# Patient Record
Sex: Female | Born: 1975 | Race: White | Hispanic: No | Marital: Married | Smoking: Current every day smoker
Health system: Southern US, Community
[De-identification: ages and names within clinical notes are randomized; demographics above are authoritative.]

## PROBLEM LIST (undated history)

## (undated) DIAGNOSIS — G479 Sleep disorder, unspecified: Secondary | ICD-10-CM

## (undated) DIAGNOSIS — L509 Urticaria, unspecified: Secondary | ICD-10-CM

## (undated) DIAGNOSIS — M8580 Other specified disorders of bone density and structure, unspecified site: Secondary | ICD-10-CM

## (undated) DIAGNOSIS — F419 Anxiety disorder, unspecified: Secondary | ICD-10-CM

## (undated) HISTORY — DX: Other specified disorders of bone density and structure, unspecified site: M85.80

## (undated) HISTORY — DX: Sleep disorder, unspecified: G47.9

## (undated) HISTORY — PX: OTHER SURGICAL HISTORY: SHX169

## (undated) HISTORY — DX: Anxiety disorder, unspecified: F41.9

## (undated) HISTORY — DX: Urticaria, unspecified: L50.9

---
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
## 1998-10-23 HISTORY — PX: TUBAL LIGATION: SHX77

## 1998-10-23 HISTORY — PX: APPENDECTOMY: SHX54

## 2000-10-23 HISTORY — PX: SUTURE REMOVAL: SHX6354

## 2016-11-15 ENCOUNTER — Ambulatory Visit: Payer: Self-pay | Admitting: Allergy and Immunology

## 2016-11-27 ENCOUNTER — Ambulatory Visit (INDEPENDENT_AMBULATORY_CARE_PROVIDER_SITE_OTHER): Payer: Medicaid Other | Admitting: Allergy and Immunology

## 2016-11-27 ENCOUNTER — Encounter: Payer: Self-pay | Admitting: Allergy and Immunology

## 2016-11-27 VITALS — BP 112/78 | HR 84 | Temp 98.2°F | Resp 16 | Ht 68.11 in | Wt 196.4 lb

## 2016-11-27 DIAGNOSIS — L509 Urticaria, unspecified: Secondary | ICD-10-CM

## 2016-11-27 MED ORDER — RANITIDINE HCL 150 MG PO TABS: 150.0000 mg | ORAL_TABLET | Freq: Every day | ORAL | 5 refills | Status: DC

## 2016-11-27 MED ORDER — CETIRIZINE HCL 10 MG PO TABS: 10.0000 mg | ORAL_TABLET | Freq: Every day | ORAL | 5 refills | Status: AC

## 2016-11-27 MED ORDER — EPINEPHRINE 0.3 MG/0.3ML IJ SOAJ: INTRAMUSCULAR | 3 refills | Status: AC

## 2016-11-27 NOTE — Patient Instructions (Addendum)
  1. Allergen avoidance measures?  2. Blood - CBC w/diff, CMP, TSH, T4, TP, SED, UA  3. Review Dr. Sandy SalaamWoodyear's allergy panel blood test  4. Continue montelukast 10 mg daily  5. If needed can utilize the following plan:   A. continue montelukast 10 mg daily  B. start cetirizine 10 mg daily  C. start ranitidine 150 mg daily  D. can add Benadryl if needed  6. Epi-Pen, Benadryl, MD/ER evaluation for allergic reaction  7. Further evaluation? Yes if recurrent  8. Contact clinic with outbreak

## 2016-11-27 NOTE — Progress Notes (Signed)
Dear Dr. Coralee Pesa,  Thank you for referring Vanessa Brooks to the [facility] [place] on 11/27/2016.   Below is a summation of this patient's evaluation and recommendations.  Thank you for your referral. I will keep you informed about this patient's response to treatment.   If you have any questions please do not hesitate to contact me.   Sincerely,  Jessica Priest, MD Allergy / Immunology  [facility]   ______________________________________________________________________    NEW PATIENT NOTE  Referring Provider: Allyson Sabal, MD Primary Provider: Allyson Sabal, MD Date of office visit: 11/27/2016    Subjective:   Chief Complaint:  Vanessa Brooks (DOB: 1976-03-17) is a 41 y.o. female who presents to the clinic on 11/27/2016 with a chief complaint of Urticaria .     HPI: [place] presents to this clinic in evaluation of hives. She has a long history of chronic idiopathic urticaria of many decades duration that appears to flare with a waxing and waning pattern on every other mouth basis usually for a month or so. She had a flare up in February 2017 and did relatively well until December 2017. She still had active disease since that December flareup until she started Singulair. She's been on Singulair for 2 weeks and her hives have resolved.  She shows me a picture from her cell phone of giant urticarial lesions approximately 15-20 cm diameter. They are extremely erythematous.  She never has any healing with scar or hyperpigmentation. She does have occasional breathing problems associated with these giant urticarial lesions and feeling as though her throat sometimes gets swollen. She's been to the emergency room both in February and December of last year in evaluation of this issue. It sounds as though she's usually treated with a systemic steroid. She uses Benadryl on a pretty regular basis as  well. She's been given several other antihistamines in the past including cyproheptadine which she doesn't really think helps her very much.  There does not appear to be any obvious provoking factor giving rise to these issues.  Recently she had some blood tests performed by her primary care doctor the results of which are unavailable at this point in time for review. In review of medical records it does appear as though a food and aero allergen hypersensitivity profile was ordered.  Past Medical History:  Diagnosis Date  . Anxiety disorder   . Osteopenia   . Sleep disorder   . Urticaria     Past Surgical History:  Procedure Laterality Date  . ADENOIDECTOMY  1989  . APPENDECTOMY  2000  . CHOLECYSTECTOMY  1992  . SUTURE REMOVAL  2002  . TONSILLECTOMY  1989  . TUBAL LIGATION  2000    Allergies as of 11/27/2016   No Known Allergies     Medication List      cyproheptadine 4 MG tablet Commonly known as:  PERIACTIN Take 4 mg by mouth as needed for allergies.   DAILY MULTIVITAMIN PO Take by mouth daily.   LORazepam 1 MG tablet Commonly known as:  ATIVAN Take 1 mg by mouth as needed for anxiety.   PROBIOTIC PO Take by mouth daily.   SINGULAIR 10 MG tablet Generic drug:  montelukast Take 10 mg by mouth daily.   traMADol 50 MG tablet Commonly known as:  ULTRAM Take 50 mg by mouth as needed.   traZODone 50 MG tablet Commonly known as:  DESYREL Take 50 mg  by mouth as needed for sleep.   VITAMIN D PO Take 1,200 mg by mouth daily.       Review of systems negative except as noted in HPI / PMHx or noted below:  Review of Systems  Constitutional: Negative.   HENT: Negative.   Eyes: Negative.   Respiratory: Negative.   Cardiovascular: Negative.   Gastrointestinal: Negative.   Genitourinary: Negative.   Musculoskeletal: Negative.   Skin: Negative.   Neurological: Negative.   Endo/Heme/Allergies: Negative.   Psychiatric/Behavioral: Negative.     Family  History  Problem Relation Age of Onset  . Allergic rhinitis Brother   . Diabetes Paternal Grandmother     Social History   Social History  . Marital status: Married    Spouse name: N/A  . Number of children: N/A  . Years of education: N/A   Occupational History  . Not on file.   Social History Main Topics  . Smoking status: Current Every Day Smoker    Types: E-cigarettes  . Smokeless tobacco: Never Used  . Alcohol use No  . Drug use: No  . Sexual activity: Not on file   Other Topics Concern  . Not on file   Social History Narrative  . No narrative on file    Environmental and Social history  Lives in a house with a dry environment, 2 cats located inside the household, carpeting in the bedroom, no plastic on the bed or pillow, and no smokers located inside the household. She works at [employer].  Objective:   Vitals:   11/27/16 1346  BP: 112/78  Pulse: 84  Resp: 16  Temp: 98.2 F (36.8 C)   Height: 5' 8.11" (173 cm) Weight: 196 lb 6.4 oz (89.1 kg)  Physical Exam  Constitutional: She is well-developed, well-nourished, and in no distress.  HENT:  Head: Normocephalic. Head is without right periorbital erythema and without left periorbital erythema.  Right Ear: Tympanic membrane, external ear and ear canal normal.  Left Ear: Tympanic membrane, external ear and ear canal normal.  Nose: Nose normal. No mucosal edema or rhinorrhea.  Mouth/Throat: Uvula is midline, oropharynx is clear and moist and mucous membranes are normal. No oropharyngeal exudate.  Eyes: Conjunctivae and lids are normal. Pupils are equal, round, and reactive to light.  Neck: Trachea normal. No tracheal tenderness present. No tracheal deviation present. No thyromegaly present.  Cardiovascular: Normal rate, regular rhythm, S1 normal, S2 normal and normal heart sounds.   No murmur heard. Pulmonary/Chest: Effort normal and breath sounds normal. No stridor. No tachypnea. No  respiratory distress. She has no wheezes. She has no rales. She exhibits no tenderness.  Abdominal: Soft. She exhibits no distension and no mass. There is no hepatosplenomegaly. There is no tenderness. There is no rebound and no guarding.  Musculoskeletal: She exhibits no edema or tenderness.  Lymphadenopathy:       Head (right side): No tonsillar adenopathy present.       Head (left side): No tonsillar adenopathy present.    She has no cervical adenopathy.    She has no axillary adenopathy.  Neurological: She is alert. Gait normal.  Skin: No rash noted. She is not diaphoretic. No erythema. No pallor. Nails show no clubbing.  Psychiatric: Mood and affect normal.    Diagnostics: None  Assessment and Plan:    1. Urticaria     1. Allergen avoidance measures?  2. Blood - CBC w/diff, CMP, TSH, T4, TP, SED, UA  3. Review  Dr. Sandy SalaamWoodyear's allergy panel blood test  4. Continue montelukast 10 mg daily  5. If needed can utilize the following plan:   A. continue montelukast 10 mg daily  B. start cetirizine 10 mg daily  C. start ranitidine 150 mg daily  D. can add Benadryl if needed  6. Epi-Pen, Benadryl, MD/ER evaluation for allergic reaction  7. Further evaluation? Yes if recurrent  8. Contact clinic with outbreak  [name] has discovered an agent that gives rise to excellent control of her urticaria and this will make us be somewhat hesitant about pursuing further evaluation and treatment for her abnormal immune system manifested as chronic urticaria. I will do a cursory exam of her major organ function and to see if she does have chronic urticaria associate with thyroiditis. She will continue to use montelukast and I given her selection of other agents that she could utilize if she does develop increased activity of this issue while remaining on montelukast. I did give her a epinephrine autoinjector device especially given the fact that she does occasionally develop systemic symptoms  involving her respiratory tract with her urticarial reactions. She will contact me should she develop recurrent problems in the future with the therapy mentioned above. Obviously if she continues to have recurrent outbreaks as she moves forward then she is going to require further evaluation and treatment.  Jessica PriestEric J. Yarielys Beed, MD [facility] [place]

## 2016-11-29 LAB — CBC WITH DIFFERENTIAL/PLATELET
BASOS: 1 %
Basophils Absolute: 0.1 10*3/uL (ref 0.0–0.2)
EOS (ABSOLUTE): 0.4 10*3/uL (ref 0.0–0.4)
EOS: 4 %
HEMATOCRIT: 40.8 % (ref 34.0–46.6)
Hemoglobin: 13.6 g/dL (ref 11.1–15.9)
Immature Grans (Abs): 0 10*3/uL (ref 0.0–0.1)
Immature Granulocytes: 0 %
LYMPHS ABS: 2.9 10*3/uL (ref 0.7–3.1)
Lymphs: 30 %
MCH: 30.1 pg (ref 26.6–33.0)
MCHC: 33.3 g/dL (ref 31.5–35.7)
MCV: 90 fL (ref 79–97)
MONOS ABS: 0.8 10*3/uL (ref 0.1–0.9)
Monocytes: 8 %
Neutrophils Absolute: 5.5 10*3/uL (ref 1.4–7.0)
Neutrophils: 57 %
PLATELETS: 406 10*3/uL — AB (ref 150–379)
RBC: 4.52 x10E6/uL (ref 3.77–5.28)
RDW: 13.9 % (ref 12.3–15.4)
WBC: 9.8 10*3/uL (ref 3.4–10.8)

## 2016-11-29 LAB — URINALYSIS
BILIRUBIN UA: NEGATIVE
Glucose, UA: NEGATIVE
Ketones, UA: NEGATIVE
Leukocytes, UA: NEGATIVE
NITRITE UA: NEGATIVE
PH UA: 6.5 (ref 5.0–7.5)
Protein, UA: NEGATIVE
RBC UA: NEGATIVE
SPEC GRAV UA: 1.006 (ref 1.005–1.030)
UUROB: 0.2 mg/dL (ref 0.2–1.0)

## 2016-11-29 LAB — THYROID PEROXIDASE ANTIBODY: THYROID PEROXIDASE ANTIBODY: 66 [IU]/mL — AB (ref 0–34)

## 2016-11-29 LAB — COMPREHENSIVE METABOLIC PANEL
A/G RATIO: 1.6 (ref 1.2–2.2)
ALK PHOS: 47 IU/L (ref 39–117)
ALT: 10 IU/L (ref 0–32)
AST: 13 IU/L (ref 0–40)
Albumin: 4.6 g/dL (ref 3.5–5.5)
BUN/Creatinine Ratio: 14 (ref 9–23)
BUN: 9 mg/dL (ref 6–24)
Bilirubin Total: 0.5 mg/dL (ref 0.0–1.2)
CHLORIDE: 99 mmol/L (ref 96–106)
CO2: 23 mmol/L (ref 18–29)
Calcium: 9.2 mg/dL (ref 8.7–10.2)
Creatinine, Ser: 0.64 mg/dL (ref 0.57–1.00)
GFR calc Af Amer: 127 mL/min/{1.73_m2} (ref >59)
GFR, EST NON AFRICAN AMERICAN: 110 mL/min/{1.73_m2} (ref >59)
GLOBULIN, TOTAL: 2.8 g/dL (ref 1.5–4.5)
Glucose: 97 mg/dL (ref 65–99)
POTASSIUM: 4.2 mmol/L (ref 3.5–5.2)
SODIUM: 140 mmol/L (ref 134–144)
Total Protein: 7.4 g/dL (ref 6.0–8.5)

## 2016-11-29 LAB — TSH+FREE T4
Free T4: 1.12 ng/dL (ref 0.82–1.77)
TSH: 6.45 u[IU]/mL — ABNORMAL HIGH (ref 0.450–4.500)

## 2016-11-29 LAB — SEDIMENTATION RATE: Sed Rate: 2 mm/hr (ref 0–32)

## 2016-12-21 ENCOUNTER — Ambulatory Visit: Payer: Medicaid Other | Admitting: Allergy and Immunology

## 2017-02-22 ENCOUNTER — Ambulatory Visit: Payer: Medicaid Other | Admitting: Allergy and Immunology

## 2017-10-05 ENCOUNTER — Emergency Department (HOSPITAL_COMMUNITY)
Admission: EM | Admit: 2017-10-05 | Discharge: 2017-10-06 | Disposition: A | Discharge status: Home / Self Care | Payer: Medicaid Other | Attending: Emergency Medicine | Admitting: Emergency Medicine

## 2017-10-05 ENCOUNTER — Emergency Department (HOSPITAL_COMMUNITY): Payer: Medicaid Other

## 2017-10-05 ENCOUNTER — Other Ambulatory Visit: Payer: Self-pay

## 2017-10-05 DIAGNOSIS — R51 Headache: Secondary | ICD-10-CM | POA: Diagnosis not present

## 2017-10-05 DIAGNOSIS — Y92410 Unspecified street and highway as the place of occurrence of the external cause: Secondary | ICD-10-CM | POA: Insufficient documentation

## 2017-10-05 DIAGNOSIS — Y999 Unspecified external cause status: Secondary | ICD-10-CM | POA: Insufficient documentation

## 2017-10-05 DIAGNOSIS — F17210 Nicotine dependence, cigarettes, uncomplicated: Secondary | ICD-10-CM | POA: Diagnosis not present

## 2017-10-05 DIAGNOSIS — S22000A Wedge compression fracture of unspecified thoracic vertebra, initial encounter for closed fracture: Secondary | ICD-10-CM | POA: Diagnosis not present

## 2017-10-05 DIAGNOSIS — R0789 Other chest pain: Secondary | ICD-10-CM | POA: Diagnosis not present

## 2017-10-05 DIAGNOSIS — Y939 Activity, unspecified: Secondary | ICD-10-CM | POA: Insufficient documentation

## 2017-10-05 DIAGNOSIS — Z23 Encounter for immunization: Secondary | ICD-10-CM | POA: Diagnosis not present

## 2017-10-05 DIAGNOSIS — M5134 Other intervertebral disc degeneration, thoracic region: Secondary | ICD-10-CM | POA: Insufficient documentation

## 2017-10-05 DIAGNOSIS — S3992XA Unspecified injury of lower back, initial encounter: Secondary | ICD-10-CM | POA: Diagnosis present

## 2017-10-05 DIAGNOSIS — Q7649 Other congenital malformations of spine, not associated with scoliosis: Secondary | ICD-10-CM

## 2017-10-05 LAB — CBC
HCT: 40.1 % (ref 36.0–46.0)
HEMOGLOBIN: 13.4 g/dL (ref 12.0–15.0)
MCH: 30.5 pg (ref 26.0–34.0)
MCHC: 33.4 g/dL (ref 30.0–36.0)
MCV: 91.3 fL (ref 78.0–100.0)
PLATELETS: 379 10*3/uL (ref 150–400)
RBC: 4.39 MIL/uL (ref 3.87–5.11)
RDW: 13.4 % (ref 11.5–15.5)
WBC: 25.3 10*3/uL — ABNORMAL HIGH (ref 4.0–10.5)

## 2017-10-05 LAB — COMPREHENSIVE METABOLIC PANEL
ALBUMIN: 3.9 g/dL (ref 3.5–5.0)
ALK PHOS: 49 U/L (ref 38–126)
ALT: 33 U/L (ref 14–54)
ANION GAP: 7 (ref 5–15)
AST: 56 U/L — ABNORMAL HIGH (ref 15–41)
BILIRUBIN TOTAL: 0.8 mg/dL (ref 0.3–1.2)
BUN: 8 mg/dL (ref 6–20)
CALCIUM: 8.8 mg/dL — AB (ref 8.9–10.3)
CO2: 25 mmol/L (ref 22–32)
CREATININE: 0.75 mg/dL (ref 0.44–1.00)
Chloride: 107 mmol/L (ref 101–111)
GFR calc Af Amer: >60 mL/min (ref >60)
GFR calc non Af Amer: >60 mL/min (ref >60)
GLUCOSE: 105 mg/dL — AB (ref 65–99)
Potassium: 3.7 mmol/L (ref 3.5–5.1)
Sodium: 139 mmol/L (ref 135–145)
TOTAL PROTEIN: 7.1 g/dL (ref 6.5–8.1)

## 2017-10-05 LAB — URINALYSIS, ROUTINE W REFLEX MICROSCOPIC
Bilirubin Urine: NEGATIVE
GLUCOSE, UA: NEGATIVE mg/dL
Ketones, ur: 5 mg/dL — AB
Leukocytes, UA: NEGATIVE
Nitrite: NEGATIVE
PH: 7 (ref 5.0–8.0)
Protein, ur: 30 mg/dL — AB
SPECIFIC GRAVITY, URINE: 1.01 (ref 1.005–1.030)

## 2017-10-05 LAB — PROTIME-INR
INR: 1.01
PROTHROMBIN TIME: 13.2 s (ref 11.4–15.2)

## 2017-10-05 LAB — PREGNANCY, URINE: PREG TEST UR: NEGATIVE

## 2017-10-05 LAB — ETHANOL

## 2017-10-05 LAB — I-STAT CG4 LACTIC ACID, ED: Lactic Acid, Venous: 1.25 mmol/L (ref 0.5–1.9)

## 2017-10-05 LAB — HCG, QUANTITATIVE, PREGNANCY: hCG, Beta Chain, Quant, S: <1 m[IU]/mL (ref <5)

## 2017-10-05 MED ORDER — FENTANYL CITRATE (PF) 100 MCG/2ML IJ SOLN
50.0000 ug | Freq: Once | INTRAMUSCULAR | Status: AC
  Administered 2017-10-05: 50 ug via INTRAVENOUS
  Filled 2017-10-05: qty 2

## 2017-10-05 MED ORDER — HYDROMORPHONE HCL 1 MG/ML IJ SOLN
1.0000 mg | Freq: Once | INTRAMUSCULAR | Status: AC
  Administered 2017-10-05: 1 mg via INTRAVENOUS
  Filled 2017-10-05: qty 1

## 2017-10-05 MED ORDER — HYDROCODONE-ACETAMINOPHEN 5-325 MG PO TABS
2.0000 | ORAL_TABLET | Freq: Once | ORAL | Status: AC
  Administered 2017-10-05: 2 via ORAL
  Filled 2017-10-05: qty 2

## 2017-10-05 MED ORDER — IOPAMIDOL (ISOVUE-300) INJECTION 61%
INTRAVENOUS | Status: AC
  Administered 2017-10-05: 100 mL
  Filled 2017-10-05: qty 100

## 2017-10-05 MED ORDER — SODIUM CHLORIDE 0.9 % IV BOLUS (SEPSIS)
500.0000 mL | Freq: Once | INTRAVENOUS | Status: AC
  Administered 2017-10-05: 500 mL via INTRAVENOUS

## 2017-10-05 MED ORDER — TETANUS-DIPHTH-ACELL PERTUSSIS 5-2.5-18.5 LF-MCG/0.5 IM SUSP
0.5000 mL | Freq: Once | INTRAMUSCULAR | Status: AC
  Administered 2017-10-05: 0.5 mL via INTRAMUSCULAR
  Filled 2017-10-05: qty 0.5

## 2017-10-05 MED ORDER — OXYCODONE-ACETAMINOPHEN 5-325 MG PO TABS
2.0000 | ORAL_TABLET | Freq: Once | ORAL | Status: DC
  Filled 2017-10-05: qty 2
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
## 2017-10-05 NOTE — ED Notes (Signed)
Called [employer] for TLSO brace.
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
## 2017-10-06 MED ORDER — HYDROCODONE-ACETAMINOPHEN 5-325 MG PO TABS
2.0000 | ORAL_TABLET | Freq: Once | ORAL | Status: AC
  Administered 2017-10-06: 2 via ORAL
  Filled 2017-10-06: qty 2

## 2017-10-06 MED ORDER — IBUPROFEN 800 MG PO TABS
800.0000 mg | ORAL_TABLET | Freq: Once | ORAL | Status: AC
  Administered 2017-10-06: 800 mg via ORAL
  Filled 2017-10-06: qty 1

## 2017-10-06 MED ORDER — HYDROCODONE-ACETAMINOPHEN 5-325 MG PO TABS: 2.0000 | ORAL_TABLET | ORAL | 0 refills | Status: DC | PRN

## 2017-10-06 MED ORDER — IBUPROFEN 400 MG PO TABS: 400.0000 mg | ORAL_TABLET | Freq: Four times a day (QID) | ORAL | 0 refills | Status: DC
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
## 2019-06-09 ENCOUNTER — Ambulatory Visit (INDEPENDENT_AMBULATORY_CARE_PROVIDER_SITE_OTHER): Payer: Medicaid Other | Admitting: Allergy and Immunology

## 2019-06-09 ENCOUNTER — Other Ambulatory Visit: Payer: Self-pay

## 2019-06-09 ENCOUNTER — Encounter: Payer: Self-pay | Admitting: Allergy and Immunology

## 2019-06-09 VITALS — BP 110/64 | HR 72 | Temp 97.9°F | Resp 12 | Ht 68.7 in | Wt 201.2 lb

## 2019-06-09 DIAGNOSIS — L230 Allergic contact dermatitis due to metals: Secondary | ICD-10-CM

## 2019-06-09 DIAGNOSIS — L500 Allergic urticaria: Secondary | ICD-10-CM | POA: Diagnosis not present

## 2019-06-09 NOTE — Patient Instructions (Signed)
  1.  Continue montelukast 10 mg and cetirizine 10 mg 1 time per day as needed  2.  Patch test for metals placed today  3.  Return to clinic in 48 hours for initial read of patch test

## 2019-06-09 NOTE — Progress Notes (Signed)
[place]   Follow-up Note  Referring Provider: Allyson SabalWoodyear, John, MD Primary Provider: Allyson SabalWoodyear, John, MD Date of Office Visit: 06/09/2019  Subjective:   Vanessa Brooks (DOB: 07/22/1976) is a 43 y.o. female who [facility] on 06[facility]09/2019 in re-evaluation of the following:  HPI: [name] returns to this clinic in evaluation of a history of urticaria addressed during her last evaluation of 27 November 2016.  [name] has had very good control of her urticaria and with the use of montelukast and cetirizine as needed usually averaging out to about 5 times per year for a day or 2 she believes that her overactive immune system is under very good control.  She is having a surgery where a mesh will be placed.  She has a history of developing contact dermatitis with jewelry and appears to do okay wearing gold and silver jewelry but only for short period in time.  Cheaper jewelry definitely gives rise to a big reaction.  It has been recommended to her that she be tested for metal hypersensitivity.  Allergies as of 06/09/2019      Reactions   Codeine Hives   Percocet [oxycodone-acetaminophen] Hives      Medication List    cetirizine 10 MG tablet Commonly known as: ZYRTEC Take 1 tablet (10 mg total) by mouth daily.   DAILY MULTIVITAMIN PO Take by mouth daily.   EPINEPHrine 0.3 mg/0.3 mL Soaj injection Commonly known as: EPI-PEN Use as directed for life-threatening allergic reaction.   LORazepam 1 MG tablet Commonly known as: ATIVAN Take 1 mg by mouth as needed for anxiety.   Singulair 10 MG tablet Generic drug: montelukast Take 10 mg by mouth daily.   traMADol 50 MG tablet Commonly known as: ULTRAM Take 50 mg by mouth as needed.       Past Medical History:  Diagnosis Date  . Anxiety disorder   . Osteopenia   . Sleep disorder   . Urticaria     Past Surgical History:  Procedure Laterality Date   . ADENOIDECTOMY  1989  . APPENDECTOMY  2000  . CHOLECYSTECTOMY  1992  . OTHER SURGICAL HISTORY     Tubal Reversal  . SUTURE REMOVAL  2002  . TONSILLECTOMY  1989  . TUBAL LIGATION  2000    Review of systems negative except as noted in HPI / PMHx or noted below:  Review of Systems  Constitutional: Negative.   HENT: Negative.   Eyes: Negative.   Respiratory: Negative.   Cardiovascular: Negative.   Gastrointestinal: Negative.   Genitourinary: Negative.   Musculoskeletal: Negative.   Skin: Negative.   Neurological: Negative.   Endo/Heme/Allergies: Negative.   Psychiatric/Behavioral: Negative.      Objective:   Vitals:   06/09/19 1647  BP: 110/64  Pulse: 72  Resp: 12  Temp: 97.9 F (36.6 C)   Height: 5' 8.7" (174.5 cm)  Weight: 201 lb 3.2 oz (91.3 kg)   Physical Exam Constitutional:      Appearance: She is not diaphoretic.  HENT:     Head: Normocephalic.     Right Ear: Tympanic membrane, ear canal and external ear normal.     Left Ear: Tympanic membrane, ear canal and external ear normal.     Nose: Nose normal. No mucosal edema or rhinorrhea.     Mouth/Throat:     Pharynx: Uvula midline. No oropharyngeal exudate.  Eyes:     Conjunctiva/sclera: Conjunctivae normal.  Neck:     Thyroid: No thyromegaly.     Trachea: Trachea normal. No tracheal tenderness or tracheal deviation.  Cardiovascular:     Rate and Rhythm: Normal rate and regular rhythm.     Heart sounds: Normal heart sounds, S1 normal and S2 normal. No murmur.  Pulmonary:     Effort: No respiratory distress.     Breath sounds: Normal breath sounds. No stridor. No wheezing or rales.  Lymphadenopathy:     Head:     Right side of head: No tonsillar adenopathy.     Left side of head: No tonsillar adenopathy.     Cervical: No cervical adenopathy.  Skin:    Findings: No erythema or rash.     Nails: There is no clubbing.   Neurological:     Mental Status: She is alert.     Diagnostics: none   Assessment and Plan:   1. Allergic contact dermatitis due to metals   2. Allergic urticaria     1.  Continue montelukast 10 mg and cetirizine 10 mg 1 time per day as needed  2.  Patch test for metals placed today  3.  Return to clinic in 48 hours for initial read of patch test  [place] appears to be doing very well on her current therapy which is as needed use of montelukast and cetirizine regarding control of her urticaria.  To work through her history of allergic contact dermatitis to metals we have placed patch test for metals and she will return to this clinic in 48 hours for her initial read of this patch test.  Vanessa Katz, MD Allergy / [place]

## 2019-06-10 ENCOUNTER — Encounter: Payer: Self-pay | Admitting: Allergy and Immunology

## 2019-06-11 ENCOUNTER — Ambulatory Visit: Payer: Medicaid Other | Admitting: Allergy and Immunology

## 2019-06-11 ENCOUNTER — Other Ambulatory Visit: Payer: Self-pay

## 2019-06-11 DIAGNOSIS — L230 Allergic contact dermatitis due to metals: Secondary | ICD-10-CM

## 2019-06-12 ENCOUNTER — Encounter: Payer: Self-pay | Admitting: Allergy and Immunology

## 2019-06-12 NOTE — Progress Notes (Signed)
Vanessa Brooks returns to this clinic to have her metal patch test read at the 37-SMOL application interval.  She did not demonstrate any hypersensitivity against any of the metals applied to her skin
